# Patient Record
Sex: Male | Born: 1975 | Race: White | Hispanic: No | Marital: Married | Smoking: Current every day smoker
Health system: Southern US, Community
[De-identification: ages and names within clinical notes are randomized; demographics above are authoritative.]

## PROBLEM LIST (undated history)

## (undated) DIAGNOSIS — F329 Major depressive disorder, single episode, unspecified: Secondary | ICD-10-CM

## (undated) DIAGNOSIS — F32A Depression, unspecified: Secondary | ICD-10-CM

## (undated) HISTORY — DX: Major depressive disorder, single episode, unspecified: F32.9

## (undated) HISTORY — DX: Depression, unspecified: F32.A

---

## 2008-12-30 ENCOUNTER — Encounter: Admission: RE | Admit: 2008-12-30 | Discharge: 2008-12-30 | Payer: Self-pay | Admitting: Family Medicine

## 2010-09-21 IMAGING — CR DG ORBITS FOR FOREIGN BODY
2 series · 2 of 2 positions shown · non-contrast
Comparison: None available.

CLINICAL DATA: Screening examination.  Patient for MRI.

ORBITS FOR FOREIGN BODY - 2 VIEW

[view not recorded (1 of 2)]
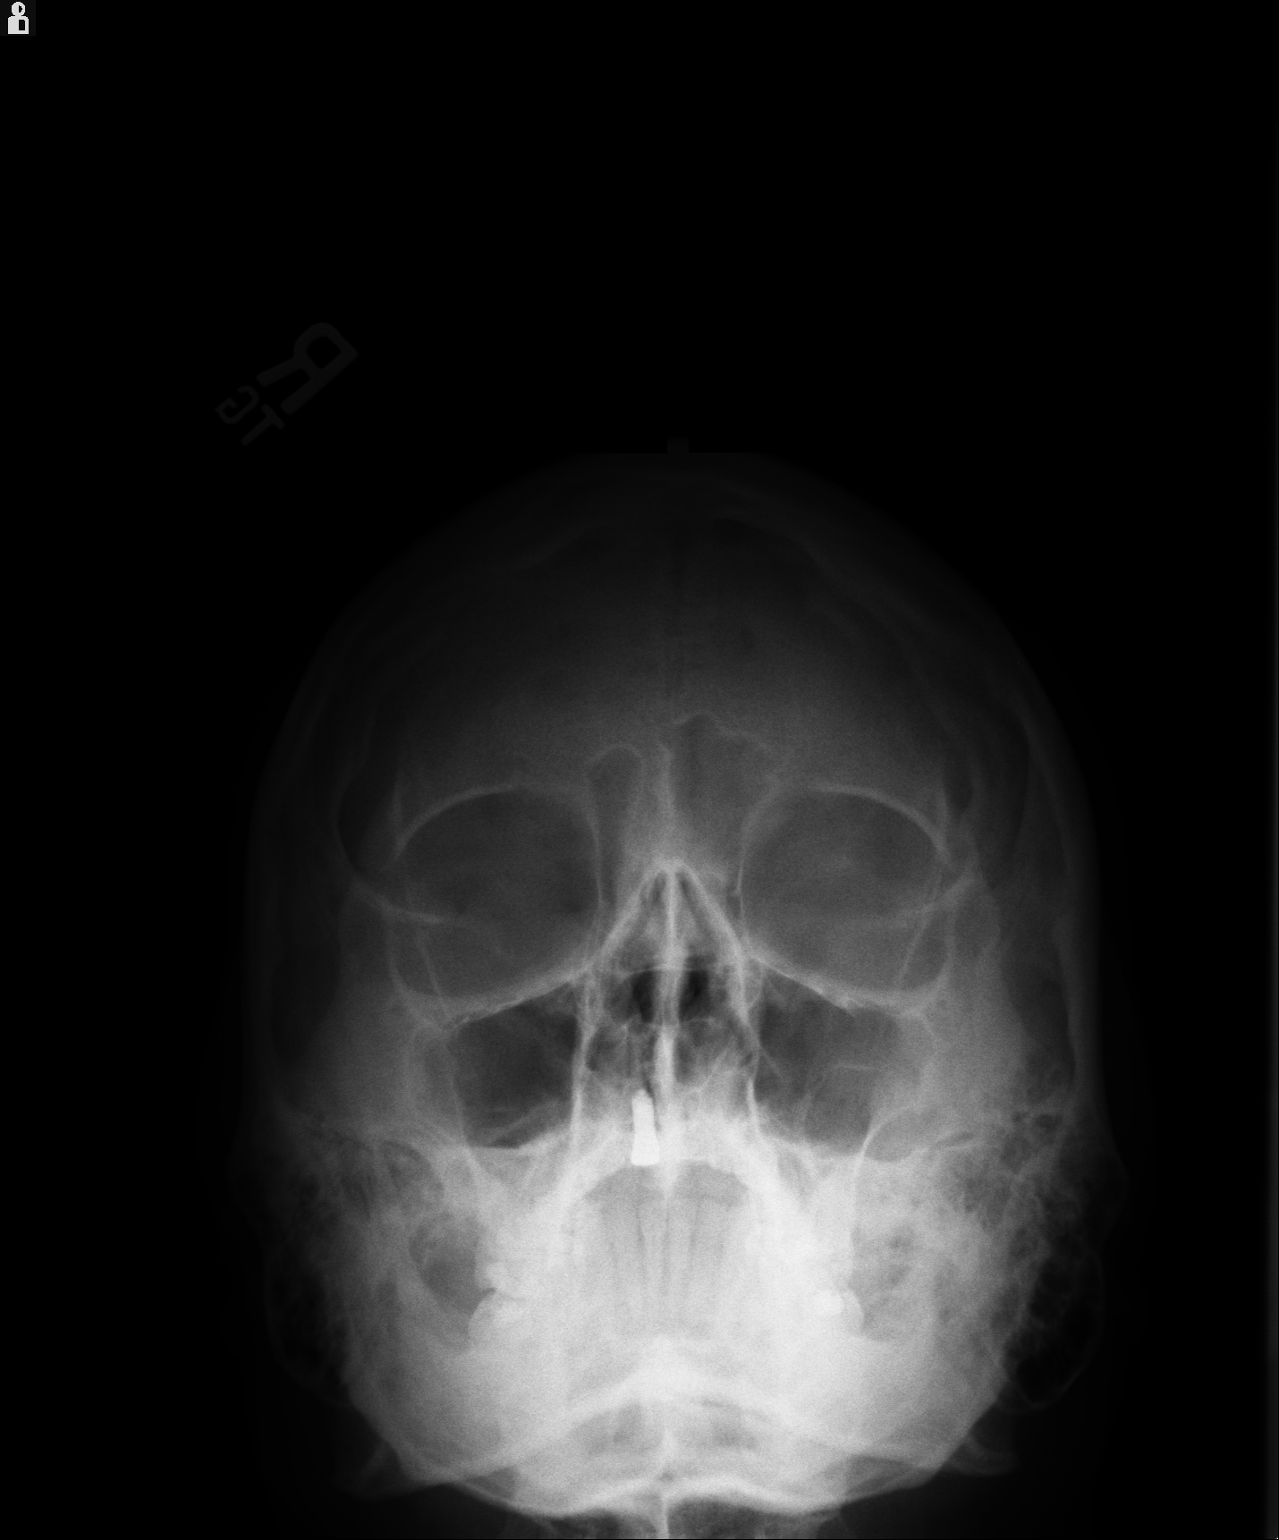

[view not recorded (2 of 2)]
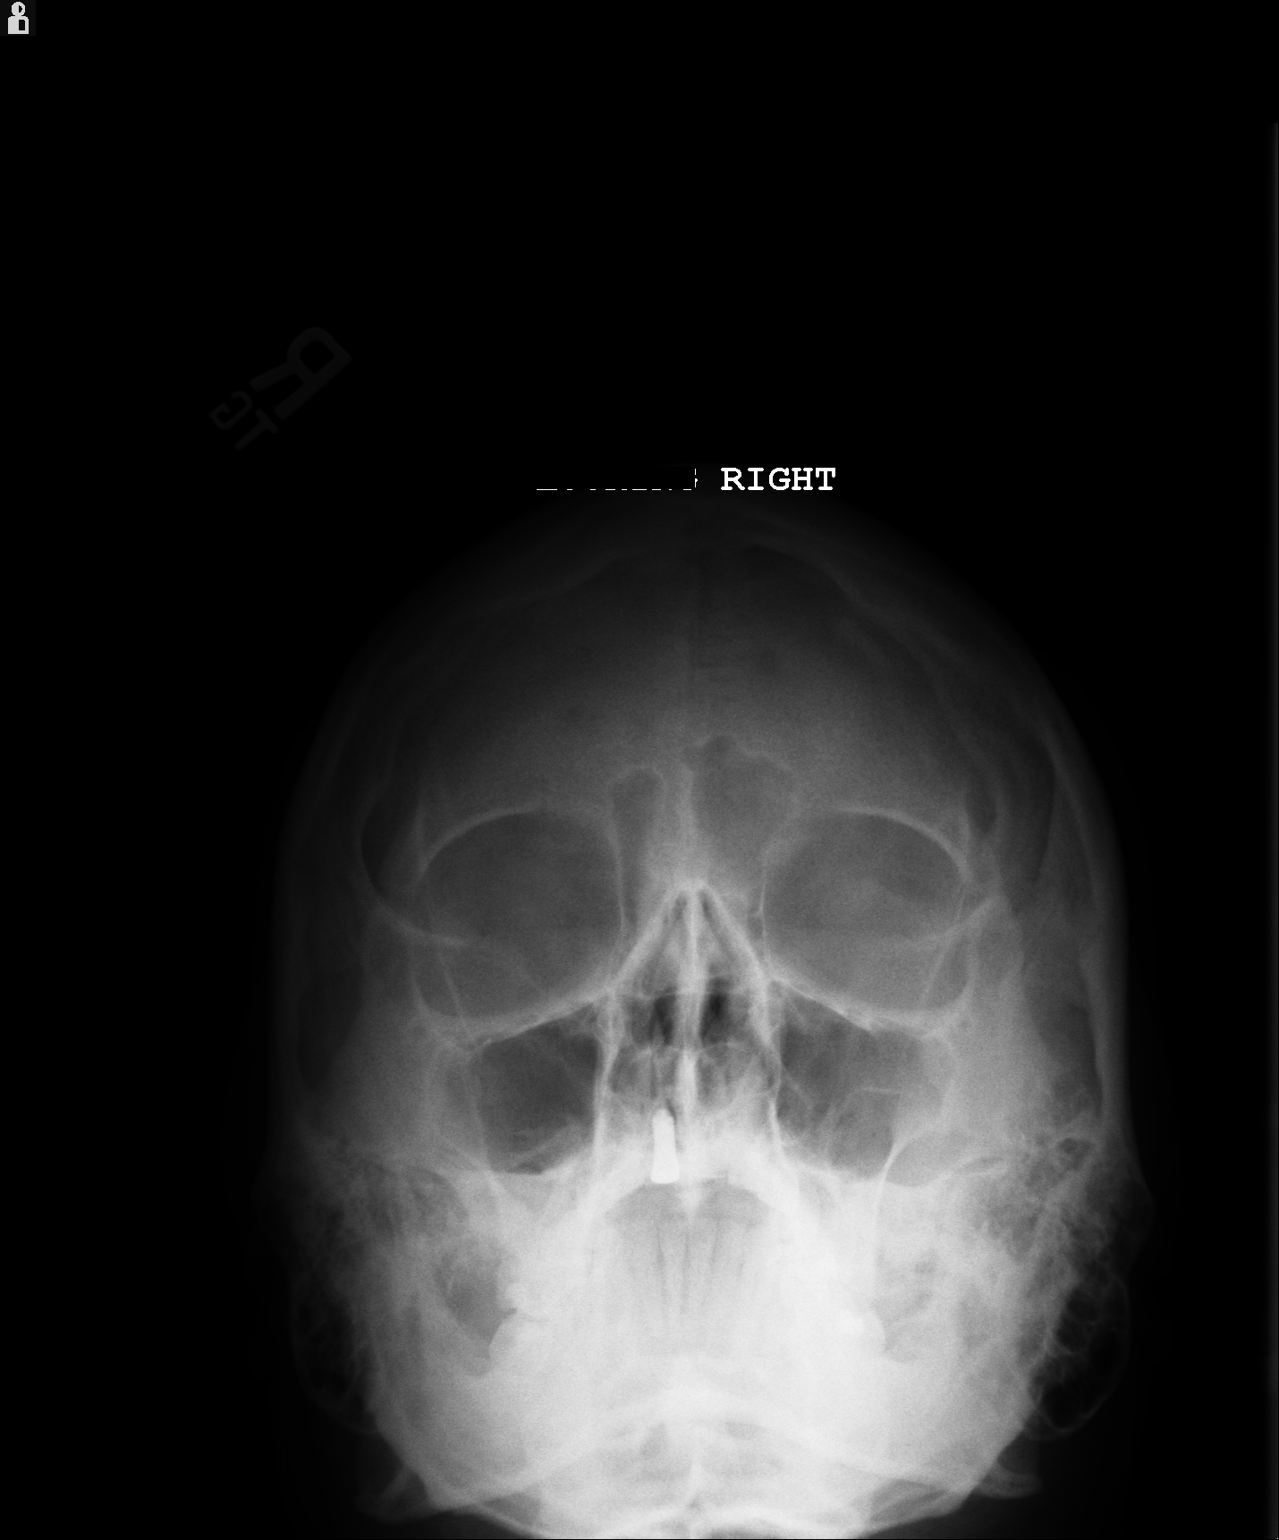

[2 of 2 positions shown; findings below may reference images not displayed]

FINDINGS: No radiopaque foreign body is identified.
IMPRESSION: The patient may proceed to MRI.

## 2012-03-04 ENCOUNTER — Encounter: Payer: Self-pay | Admitting: Internal Medicine

## 2012-12-13 ENCOUNTER — Ambulatory Visit (INDEPENDENT_AMBULATORY_CARE_PROVIDER_SITE_OTHER): Payer: BC Managed Care – PPO | Admitting: Family Medicine

## 2012-12-13 VITALS — BP 130/90 | HR 70 | Temp 98.0°F | Resp 18 | Ht 71.0 in | Wt 201.0 lb

## 2012-12-13 DIAGNOSIS — F32A Depression, unspecified: Secondary | ICD-10-CM

## 2012-12-13 DIAGNOSIS — F3289 Other specified depressive episodes: Secondary | ICD-10-CM

## 2012-12-13 DIAGNOSIS — F329 Major depressive disorder, single episode, unspecified: Secondary | ICD-10-CM

## 2012-12-13 DIAGNOSIS — L723 Sebaceous cyst: Secondary | ICD-10-CM

## 2012-12-13 MED ORDER — FLUOXETINE HCL 40 MG PO CAPS: 40.0000 mg | ORAL_CAPSULE | Freq: Every day | ORAL | Status: DC

## 2012-12-13 MED ORDER — FLUOXETINE HCL 20 MG PO TABS: 20.0000 mg | ORAL_TABLET | Freq: Every day | ORAL | Status: DC

## 2012-12-13 MED ORDER — CEPHALEXIN 500 MG PO CAPS: 500.0000 mg | ORAL_CAPSULE | Freq: Four times a day (QID) | ORAL | Status: DC

## 2012-12-13 NOTE — Progress Notes (Signed)
[facility]:  Office Visit  Chief Complaint:  Chief Complaint  Patient presents with  . Mass    Neck    HPI: Alex Cochran is a 37 y.o. male who complains of : 1. Knot on left side of his neck for many years but recently it got bigger and inflammed and hurts. It has been red and hot, today has been better. Deneis fevers, chills.  2. Prozac x 3 years, just had kids, wife issues when he started , and he has been on it since  Because it helped, he has been off of it for several months since has not been back into office. He has 2 kids 62 y/o  and 2 y/o. SIDGECAP 2/8. Deneis SI and HI. Denies thyroid disease. Last set of labs 2 years ago TSH was normal.   Past Medical History  Diagnosis Date  . Depression    History reviewed. No pertinent past surgical history. History   Social History  . Marital Status: Married    Spouse Name: N/A    Number of Children: N/A  . Years of Education: N/A   Social History Main Topics  . Smoking status: Current Every Day Smoker -- 1.00 packs/day    Types: Cigarettes  . Smokeless tobacco: None  . Alcohol Use: None  . Drug Use: None  . Sexual Activity: None   Other Topics Concern  . None   Social History Narrative  . None   No family history on file. No Known Allergies Prior to Admission medications   Not on File     ROS: The patient denies fevers, chills, night sweats, unintentional weight loss, chest pain, palpitations, wheezing, dyspnea on exertion, nausea, vomiting, abdominal pain, dysuria, hematuria, melena, numbness, weakness, or tingling.   All other systems have been reviewed and were otherwise negative with the exception of those mentioned in the HPI and as above.    PHYSICAL EXAM: Filed Vitals:   12/13/12 0914  BP: 130/90  Pulse: 70  Temp: 98 F (36.7 C)  Resp: 18   Filed Vitals:   12/13/12 0914  Height: 5\' 11"  (1.803 m)  Weight: 201 lb (91.173 kg)   Body mass index is 28.05  kg/(m^2).  General: Alert, no acute distress HEENT:  Normocephalic, atraumatic, oropharynx patent. EOMI, PERRLA Cardiovascular:  Regular rate and rhythm, no rubs murmurs or gallops.  No Carotid bruits, radial pulse intact. No pedal edema.  Respiratory: Clear to auscultation bilaterally.  No wheezes, rales, or rhonchi.  No cyanosis, no use of accessory musculature GI: No organomegaly, abdomen is soft and non-tender, positive bowel sounds.  No masses. Skin: + 1/2 inch by 1/2 inch inflammed sebaceous cyst on left posterior neck Neurologic: Facial musculature symmetric. Psychiatric: Patient is appropriate throughout our interaction. Lymphatic: No cervical lymphadenopathy Musculoskeletal: Gait intact.   LABS: No results found for this or any previous visit.   EKG/XRAY:   Primary read interpreted by Dr. Conley Rolls at [facility].   ASSESSMENT/PLAN: Encounter Diagnoses  Name Primary?  . Inflamed sebaceous cyst Yes  . Depression    Mood is appropriate Advise to take meds regular, need to taper and not go off rapidly Rx Prozac 40 mg daily #90, 2 RF Will cover for infectious sebaceous cyst in posterior neck, this is a tough spot and may need reerral to plastics for removal if warm compresses and abx is not affective in calmin down infection Rx Keflex x 10 days Gross sideeffects, risk and benefits, and alternatives  of medications d/w patient. Patient is aware that all medications have potential sideeffects and we are unable to predict every sideeffect or drug-drug interaction that may occur.  Hamilton Capri PHUONG, DO 12/13/2012 9:58 AM

## 2013-01-23 ENCOUNTER — Telehealth: Payer: Self-pay

## 2013-01-23 DIAGNOSIS — L720 Epidermal cyst: Secondary | ICD-10-CM

## 2013-01-23 NOTE — Telephone Encounter (Signed)
PT SAW DR. Conley Rolls ON 8/22 FOR A CYST ON HIS NECK. HE IS READY TO HAVE THE CYST REMOVED AND NEEDS TO KNOW IF HE NEEDS TO COME BACK IN FOR A REFERRAL OR IF WE CAN GO AHEAD AND REFER HIM TO SOMEONE.  PLEASE CALL [phone]

## 2013-01-23 NOTE — Telephone Encounter (Signed)
Pt advised.

## 2013-01-23 NOTE — Telephone Encounter (Signed)
Your note indicates plastic surgeon may need to remove this, please advise. Pended the referral to plastics.

## 2013-07-12 ENCOUNTER — Encounter: Payer: Self-pay | Admitting: Emergency Medicine

## 2013-07-12 ENCOUNTER — Emergency Department (INDEPENDENT_AMBULATORY_CARE_PROVIDER_SITE_OTHER)
Admission: EM | Admit: 2013-07-12 | Discharge: 2013-07-12 | Disposition: A | Discharge status: Home / Self Care | Payer: BC Managed Care – PPO | Source: Home / Self Care | Attending: Family Medicine | Admitting: Family Medicine

## 2013-07-12 DIAGNOSIS — J111 Influenza due to unidentified influenza virus with other respiratory manifestations: Secondary | ICD-10-CM

## 2013-07-12 DIAGNOSIS — R69 Illness, unspecified: Principal | ICD-10-CM

## 2013-07-12 MED ORDER — AZITHROMYCIN 250 MG PO TABS: ORAL_TABLET | ORAL | Status: AC

## 2013-07-12 MED ORDER — BENZONATATE 200 MG PO CAPS: 200.0000 mg | ORAL_CAPSULE | Freq: Every day | ORAL | Status: AC

## 2013-07-12 NOTE — Discharge Instructions (Signed)
Take plain Mucinex (1200 mg guaifenesin) twice daily for cough and congestion.  May add Sudafed for sinus congestion.   Increase fluid intake, rest. May use Afrin nasal spray (or generic oxymetazoline) twice daily for about 5 days.  Also recommend using saline nasal spray several times daily and saline nasal irrigation (AYR is a common brand) Try warm salt water gargles for sore throat.  Stop all antihistamines for now, and other non-prescription cough/cold preparations. May take Ibuprofen 200mg , 4 tabs every 8 hours with food for body aches, fever, headache, etc.

## 2013-07-12 NOTE — ED Provider Notes (Signed)
CSN: [id number]     Arrival date & time 07/12/13  1359 History   First MD Initiated Contact with Patient 07/12/13 1439     Chief Complaint  Patient presents with  . Generalized Body Aches  . Cough  . Hoarse  . Nasal Congestion     HPI Comments: Four days ago patient suddenly developed sweats, myalgias, and fatigue followed by sinus congestion, headache, and productive cough.  No sore throat.  Patient smokes.  The history is provided by the patient.    Past Medical History  Diagnosis Date  . Depression    History reviewed. No pertinent past surgical history. Family History  Problem Relation Age of Onset  . Hypertension Father    History  Substance Use Topics  . Smoking status: Current Every Day Smoker -- 1.00 packs/day    Types: Cigarettes  . Smokeless tobacco: Not on file  . Alcohol Use: Not on file    Review of Systems No sore throat + cough + hoarseness No pleuritic pain No wheezing + nasal congestion + post-nasal drainage No sinus pain/pressure No itchy/red eyes ? earache No hemoptysis No SOB + fever, + chills No nausea No vomiting No abdominal pain No diarrhea No urinary symptoms No skin rash + fatigue + myalgias + headache Used OTC meds without relief  Allergies  Review of patient's allergies indicates no known allergies.  Home Medications   Current Outpatient Rx  Name  Route  Sig  Dispense  Refill  . azithromycin (ZITHROMAX Z-PAK) 250 MG tablet      Take 2 tabs today; then begin one tab once daily for 4 more days.   6 each   0   . benzonatate (TESSALON) 200 MG capsule   Oral   Take 1 capsule (200 mg total) by mouth at bedtime. Take as needed for cough   12 capsule   0    BP 116/74  Pulse 100  Temp(Src) 99.9 F (37.7 C) (Oral)  Resp 16  SpO2 97% Physical Exam Nursing notes and Vital Signs reviewed. Appearance:  Patient appears healthy, stated age, and in no acute distress Eyes:  Pupils are equal, round, and reactive to light and  accomodation.  Extraocular movement is intact.  Conjunctivae are not inflamed  Ears:  Canals normal.  Tympanic membranes normal.  Nose:  Mildly congested turbinates.  No sinus tenderness.  Pharynx:  Normal Neck:  Supple.  No adenopathy Lungs:  Clear to auscultation.  Breath sounds are equal.  Heart:  Regular rate and rhythm without murmurs, rubs, or gallops.  Abdomen:  Nontender without masses or hepatosplenomegaly.  Bowel sounds are present.  No CVA or flank tenderness.  Extremities:  No edema.  No calf tenderness Skin:  No rash present.   ED Course  Procedures  none      MDM   1. Influenza-like illness    Too late for Tamiflu. Begin Z-pack.  Prescription written for Benzonatate [facility]) to take at bedtime for night-time cough.  Take plain Mucinex (1200 mg guaifenesin) twice daily for cough and congestion.  May add Sudafed for sinus congestion.   Increase fluid intake, rest. May use Afrin nasal spray (or generic oxymetazoline) twice daily for about 5 days.  Also recommend using saline nasal spray several times daily and saline nasal irrigation (AYR is a common brand) Try warm salt water gargles for sore throat.  Stop all antihistamines for now, and other non-prescription cough/cold preparations. May take Ibuprofen 200mg , 4 tabs every 8 hours  with food for body aches, fever, headache, etc. Followup with Family Doctor if not improved in one week.     Lattie HawStephen A Jil Penland, MD 07/12/13 1524

## 2013-07-12 NOTE — ED Notes (Signed)
Patient reports 4-5 day history of congestion, cough, general body aches, with worsening today; took Tylenol/Flu OTC at noon today.

## 2013-07-15 ENCOUNTER — Telehealth: Payer: Self-pay | Admitting: *Deleted

## 2015-09-09 ENCOUNTER — Emergency Department (HOSPITAL_COMMUNITY): Payer: BLUE CROSS/BLUE SHIELD

## 2015-09-09 ENCOUNTER — Encounter (HOSPITAL_COMMUNITY): Payer: Self-pay | Admitting: *Deleted

## 2015-09-09 ENCOUNTER — Emergency Department (HOSPITAL_COMMUNITY)
Admission: EM | Admit: 2015-09-09 | Discharge: 2015-09-09 | Disposition: A | Discharge status: Home / Self Care | Payer: BLUE CROSS/BLUE SHIELD | Attending: Emergency Medicine | Admitting: Emergency Medicine

## 2015-09-09 DIAGNOSIS — W270XXA Contact with workbench tool, initial encounter: Secondary | ICD-10-CM | POA: Insufficient documentation

## 2015-09-09 DIAGNOSIS — Y998 Other external cause status: Secondary | ICD-10-CM | POA: Insufficient documentation

## 2015-09-09 DIAGNOSIS — F17210 Nicotine dependence, cigarettes, uncomplicated: Secondary | ICD-10-CM | POA: Diagnosis not present

## 2015-09-09 DIAGNOSIS — Y9289 Other specified places as the place of occurrence of the external cause: Secondary | ICD-10-CM | POA: Insufficient documentation

## 2015-09-09 DIAGNOSIS — Z8659 Personal history of other mental and behavioral disorders: Secondary | ICD-10-CM | POA: Insufficient documentation

## 2015-09-09 DIAGNOSIS — S51812A Laceration without foreign body of left forearm, initial encounter: Secondary | ICD-10-CM | POA: Insufficient documentation

## 2015-09-09 DIAGNOSIS — S61512A Laceration without foreign body of left wrist, initial encounter: Secondary | ICD-10-CM | POA: Diagnosis present

## 2015-09-09 DIAGNOSIS — Y9389 Activity, other specified: Secondary | ICD-10-CM | POA: Diagnosis not present

## 2015-09-09 DIAGNOSIS — IMO0002 Reserved for concepts with insufficient information to code with codable children: Secondary | ICD-10-CM

## 2015-09-09 DIAGNOSIS — Z23 Encounter for immunization: Secondary | ICD-10-CM | POA: Diagnosis not present

## 2015-09-09 MED ORDER — TETANUS-DIPHTH-ACELL PERTUSSIS 5-2.5-18.5 LF-MCG/0.5 IM SUSP
0.5000 mL | Freq: Once | INTRAMUSCULAR | Status: AC
  Administered 2015-09-09: 0.5 mL via INTRAMUSCULAR
  Filled 2015-09-09: qty 0.5

## 2015-09-09 MED ORDER — ACETAMINOPHEN 325 MG PO TABS: 650.0000 mg | ORAL_TABLET | Freq: Once | ORAL | Status: DC

## 2015-09-09 MED ORDER — LIDOCAINE HCL (PF) 1 % IJ SOLN
5.0000 mL | Freq: Once | INTRAMUSCULAR | Status: AC
  Administered 2015-09-09: 5 mL
  Filled 2015-09-09: qty 5

## 2015-09-09 NOTE — Discharge Instructions (Signed)
Read the information below.  You had a laceration repair in the emergency department. Be sure to keep the wound covered for 24 hours. After, you can wash with warm soapy water. Do not soak arm. Apply antibtiotic ointment and dry bandage. You can take tylenol for pain control. Be sure to follow up with your PCP in seven days for suture removal.  You may return to the Emergency Department at any time for worsening condition or any new symptoms that concern you. If you develop fever, chills, nightsweats or pain, redness, swelling, streaking up your arm, purulent discharge, or numbness/weakness in your extremity return to the ED immediately.

## 2015-09-09 NOTE — ED Notes (Signed)
Pt reports having his left wrist cut with a saw. Pt states that this happened approx 30 minutes ago. Bleeding controlled in triage. Pt unsure of last tetanus shot.

## 2015-09-09 NOTE — ED Notes (Signed)
EDP at bedside  

## 2015-09-09 NOTE — ED Provider Notes (Signed)
CSN: [id number]     Arrival date & time 09/09/15  1321 History  By signing my name below, I, [facility] Hospital[facility]arrissa Cochran, attest that this documentation has been prepared under the direction and in the presence of Arvilla MeresAshley Meyer, PA-C. Electronically Signed: Randell PatientMarrissa Cochran, ED Scribe. 09/09/2015. 2:30 PM.   Chief Complaint  Patient presents with  . Extremity Laceration   The history is provided by the patient. No language interpreter was used.  HPI Comments: Alex Cochran is a 40 y.o. male who presents to the Emergency Department complaining of 2/10, bleeding controlled, left wrist laceration that occurred 0.5 hours ago. Pt states that he was using a dirty band saw to cut a piece of pipe when the saw slipped, lacerating his left wrist and followed immediately by bleeding. He has applied pressure with a reduction in his bleeding. Denies hx of cancer, immunocompromising conditions, or any bleeding disorders. Denies regular use of blood thinners such as aspirin, Xarelto, or Coumadin. Denies numbness, tingling, weakness, CP, SOB, night diaphoresis. Patient is not UTD on tetanus.   Past Medical History  Diagnosis Date  . Depression    History reviewed. No pertinent past surgical history. Family History  Problem Relation Age of Onset  . Hypertension Father    Social History  Substance Use Topics  . Smoking status: Current Every Day Smoker -- 1.00 packs/day    Types: Cigarettes  . Smokeless tobacco: None  . Alcohol Use: None    Review of Systems  Constitutional: Negative for diaphoresis.  Respiratory: Negative for shortness of breath.   Cardiovascular: Negative for chest pain.  Skin: Positive for wound.  Neurological: Negative for weakness and numbness.      Allergies  Review of patient's allergies indicates no known allergies.  Home Medications   Prior to Admission medications   Medication Sig Start Date End Date Taking? Authorizing Provider  azithromycin (ZITHROMAX Z-PAK) 250 MG  tablet Take 2 tabs today; then begin one tab once daily for 4 more days. 07/12/13   Lattie HawStephen A Beese, MD  benzonatate (TESSALON) 200 MG capsule Take 1 capsule (200 mg total) by mouth at bedtime. Take as needed for cough 07/12/13   Lattie HawStephen A Beese, MD   BP 140/90 mmHg  Pulse 89  Temp(Src) 98.6 F (37 C) (Oral)  Resp 15  SpO2 99% Physical Exam  Constitutional: He is oriented to person, place, and time. He appears well-developed and well-nourished. No distress.  HENT:  Head: Normocephalic and atraumatic.  Eyes: Conjunctivae are normal.  Neck: Normal range of motion.  Cardiovascular: Normal rate.   Pulmonary/Chest: Effort normal. No respiratory distress.  Musculoskeletal: Normal range of motion.  2-point discrimination intact. 5/5 strength in all digits of left hand. Intact ROM of left wrist, left hand fingers, and left elbow.  Neurological: He is alert and oriented to person, place, and time.  Skin: Skin is warm and dry. Laceration noted.  Laceration on lateral distal left forearm. V in shape, about 2 inches on left side and 1inch on right. Cap refill less than 3 seconds. No tendon or bone seen.   Psychiatric: He has a normal mood and affect. His behavior is normal.  Nursing note and vitals reviewed.   ED Course  Procedures   LACERATION REPAIR PROCEDURE NOTE The patient's identification was confirmed and consent was obtained. This procedure was performed by Arvilla MeresAshley Meyer, PA-C at 2:40 PM. Site: Distal left forearm Sterile procedures observed Anesthetic used (type and amt): 5 CCs Suture type/size: 4-0 Ethilon Length:2  inches on left hand side, 1/5 inch on right hand side # of Sutures: 12 Technique:Simple interrupted Complexity: Simple Antibx ointment applied Tetanus UTD or ordered: Ordered Site anesthetized, irrigated with NS, explored without evidence of foreign body, wound well approximated, site covered with dry, sterile dressing.  Patient tolerated procedure well without  complications. Instructions for care discussed verbally and patient provided with additional written instructions for homecare and f/u.   DIAGNOSTIC STUDIES: Oxygen Saturation is 98% on RA, normal by my interpretation.    COORDINATION OF CARE: 1:30 PM Will perform laceration repair. Will order left forearm x-ray. Discussed treatment plan with pt at bedside and pt agreed to plan.  2:30 PM Reviewed results of left forearm imaging. Returned to perform laceration repair.  Labs Review Labs Reviewed - No data to display  Imaging Review Dg Forearm Left  09/09/2015  CLINICAL DATA:  Band saw injury EXAM: LEFT FOREARM - 2 VIEW COMPARISON:  None. FINDINGS: No acute fracture. No dislocation.  Unremarkable soft tissues. IMPRESSION: No acute bony pathology. Electronically Signed   By: Jolaine Click M.D.   On: 09/09/2015 14:02   I have personally reviewed and evaluated these images and lab results as part of my medical decision-making.   EKG Interpretation None      MDM   Final diagnoses:  Laceration    Alex Cochran is a 40 y.o. male presents to ED with laceration to left forearm. Bleeding is controlled. He is not on blood thinners. Patient is afebrile and non-toxic. Vital signs are stable. Strength, sensation, and ROM intact in fingers, wrist, and elbow. Two point discrimination intact. X-ray negative for fracture or dislocation. Wound cleaned and explored, no foreign bodies noted. No tendon or bone noted. Laceration repair completed and patient tolerated procedure well. Tetanus booster provided. Instructed patient on wound care and discussed follow up with PCP in 7-14 days for suture removal. Provided return precautions. Patient voiced understanding and is agreeable.    I personally performed the services described in this documentation, which was scribed in my presence. The recorded information has been reviewed and is accurate.    Lona Kettle, PA-C 09/09/15 1929  Lavera Guise, MD 09/10/15 [phone]
